# Patient Record
Sex: Male | Born: 1955 | Race: White | Hispanic: No | Marital: Married | Smoking: Never smoker
Health system: Southern US, Community
[De-identification: ages and names within clinical notes are randomized; demographics above are authoritative.]

## PROBLEM LIST (undated history)

## (undated) DIAGNOSIS — R1310 Dysphagia, unspecified: Secondary | ICD-10-CM

## (undated) DIAGNOSIS — K219 Gastro-esophageal reflux disease without esophagitis: Secondary | ICD-10-CM

## (undated) DIAGNOSIS — R21 Rash and other nonspecific skin eruption: Secondary | ICD-10-CM

## (undated) HISTORY — PX: OTHER SURGICAL HISTORY: SHX169

## (undated) HISTORY — PX: HERNIA REPAIR: SHX51

---

## 2014-11-27 ENCOUNTER — Emergency Department (HOSPITAL_COMMUNITY)
Admission: EM | Admit: 2014-11-27 | Discharge: 2014-11-27 | Disposition: A | Discharge status: Home / Self Care | Payer: BLUE CROSS/BLUE SHIELD | Attending: Emergency Medicine | Admitting: Emergency Medicine

## 2014-11-27 ENCOUNTER — Encounter (HOSPITAL_COMMUNITY): Payer: Self-pay | Admitting: Emergency Medicine

## 2014-11-27 ENCOUNTER — Emergency Department (HOSPITAL_COMMUNITY): Payer: BLUE CROSS/BLUE SHIELD

## 2014-11-27 DIAGNOSIS — R1310 Dysphagia, unspecified: Secondary | ICD-10-CM | POA: Diagnosis present

## 2014-11-27 DIAGNOSIS — Z79899 Other long term (current) drug therapy: Secondary | ICD-10-CM | POA: Insufficient documentation

## 2014-11-27 DIAGNOSIS — Z880 Allergy status to penicillin: Secondary | ICD-10-CM | POA: Insufficient documentation

## 2014-11-27 LAB — CBC WITH DIFFERENTIAL/PLATELET
BASOS ABS: 0.1 10*3/uL (ref 0.0–0.1)
Basophils Relative: 1 % (ref 0–1)
Eosinophils Absolute: 0.2 10*3/uL (ref 0.0–0.7)
Eosinophils Relative: 2 % (ref 0–5)
HCT: 46.1 % (ref 39.0–52.0)
Hemoglobin: 14.7 g/dL (ref 13.0–17.0)
LYMPHS ABS: 1.5 10*3/uL (ref 0.7–4.0)
LYMPHS PCT: 16 % (ref 12–46)
MCH: 27.5 pg (ref 26.0–34.0)
MCHC: 31.9 g/dL (ref 30.0–36.0)
MCV: 86.2 fL (ref 78.0–100.0)
Monocytes Absolute: 0.6 10*3/uL (ref 0.1–1.0)
Monocytes Relative: 7 % (ref 3–12)
Neutro Abs: 6.7 10*3/uL (ref 1.7–7.7)
Neutrophils Relative %: 74 % (ref 43–77)
Platelets: 258 10*3/uL (ref 150–400)
RBC: 5.35 MIL/uL (ref 4.22–5.81)
RDW: 14.3 % (ref 11.5–15.5)
WBC: 9.1 10*3/uL (ref 4.0–10.5)

## 2014-11-27 LAB — COMPREHENSIVE METABOLIC PANEL
ALBUMIN: 4.4 g/dL (ref 3.5–5.2)
ALK PHOS: 83 U/L (ref 39–117)
ALT: 22 U/L (ref 0–53)
AST: 22 U/L (ref 0–37)
Anion gap: 9 (ref 5–15)
BILIRUBIN TOTAL: 0.8 mg/dL (ref 0.3–1.2)
BUN: 19 mg/dL (ref 6–23)
CHLORIDE: 106 mmol/L (ref 96–112)
CO2: 25 mmol/L (ref 19–32)
CREATININE: 0.95 mg/dL (ref 0.50–1.35)
Calcium: 9.2 mg/dL (ref 8.4–10.5)
GFR calc Af Amer: >90 mL/min (ref >90)
GFR calc non Af Amer: 89 mL/min — ABNORMAL LOW (ref >90)
Glucose, Bld: 100 mg/dL — ABNORMAL HIGH (ref 70–99)
Potassium: 3.8 mmol/L (ref 3.5–5.1)
SODIUM: 140 mmol/L (ref 135–145)
TOTAL PROTEIN: 7.4 g/dL (ref 6.0–8.3)

## 2014-11-27 MED ORDER — SODIUM CHLORIDE 0.9 % IV BOLUS (SEPSIS)
1000.0000 mL | Freq: Once | INTRAVENOUS | Status: AC
  Administered 2014-11-27: 1000 mL via INTRAVENOUS

## 2014-11-27 MED ORDER — GLUCAGON HCL RDNA (DIAGNOSTIC) 1 MG IJ SOLR
1.0000 mg | Freq: Once | INTRAMUSCULAR | Status: AC
  Administered 2014-11-27: 1 mg via INTRAVENOUS
  Filled 2014-11-27: qty 1

## 2014-11-27 MED ORDER — METOCLOPRAMIDE HCL 5 MG/ML IJ SOLN
10.0000 mg | Freq: Once | INTRAMUSCULAR | Status: AC
  Administered 2014-11-27: 10 mg via INTRAVENOUS
  Filled 2014-11-27: qty 2

## 2014-11-27 MED ORDER — DIPHENHYDRAMINE HCL 50 MG/ML IJ SOLN
25.0000 mg | Freq: Once | INTRAMUSCULAR | Status: AC
  Administered 2014-11-27: 25 mg via INTRAVENOUS
  Filled 2014-11-27: qty 1

## 2014-11-27 MED ORDER — METOCLOPRAMIDE HCL 10 MG PO TABS: 10.0000 mg | ORAL_TABLET | Freq: Four times a day (QID) | ORAL | Status: DC | PRN

## 2014-11-27 MED ORDER — GI COCKTAIL ~~LOC~~
30.0000 mL | Freq: Once | ORAL | Status: AC
  Administered 2014-11-27: 30 mL via ORAL
  Filled 2014-11-27: qty 30

## 2014-11-27 NOTE — ED Notes (Signed)
Pt reports that he had "pressure" to esophagus prior to meds, but now has been relieved

## 2014-11-27 NOTE — ED Notes (Signed)
Pt from home reports that he has not swallowed fluids and kept them down since approx 11pm last night. Pt reports that woke up and felt his esophagus was inflamed. Pt sts that this has happened before we he was under stress. Pt denies N/V/D, fever, CP, SOB, visual changes, dizziness and reports he had normal BM yesterday. Pt neuro exam normal. Pt is A&O x4 and in NAD. Dr Silverio LayYao at bedside.

## 2014-11-27 NOTE — Discharge Instructions (Signed)
Stay hydrated.   Soft diet for a week.   Take reglan as needed for nausea or vomiting.   Follow up with GI. You will need to get endoscopy.   Return to ER if you have trouble swallowing, unable to keep fluids down, food stuck in throat.

## 2014-11-27 NOTE — ED Notes (Addendum)
Pt reports inability to swallow starting last night. Denies drooling, no stridor noted. Says this has been a chronic problem especially when he becomes stressed. Has seen NP and she told him "to relax and chew my food." Denies SOB, chest pain, but says, "now I feel like I need to spit my saliva out." RR even/unlabored. Reports feeling, "bloated." No other issues/concerns. Denies hx strokes, cardiac problems.

## 2014-11-27 NOTE — ED Notes (Signed)
Pt stood and reports that he "felt it go down." Pt sts that he feels much better and is less anxious than before. Pt is sipping water to make sure that it stays down. Pt is A&O and in NAD

## 2014-11-27 NOTE — ED Provider Notes (Signed)
CSN: [id number]     Arrival date & time 11/27/14  1235 History   First MD Initiated Contact with Patient 11/27/14 1500     Chief Complaint  Patient presents with  . Trouble swallowing      (Consider location/radiation/quality/duration/timing/severity/associated sxs/prior Treatment) The history is provided by the patient.  Mitchell Montoya is a 59 y.o. male history hernia repair here with trouble swallowing. He states that he has intermittent trouble swallowing for years. He was told that it's because he has anxiety but never had endoscopy. He ate some chicken and drink some milk last night around 11 PM. He woke up this morning and was unable to keep water down and needed to spit up his saliva. He also vomited up some pieces of chicken. Never had food impaction before. Never saw a GI doctor previously.    History reviewed. No pertinent past medical history. Past Surgical History  Procedure Laterality Date  . Hernia repair     History reviewed. No pertinent family history. History  Substance Use Topics  . Smoking status: Never Smoker   . Smokeless tobacco: Not on file  . Alcohol Use: No    Review of Systems  Gastrointestinal:       Foreign body sensation in chest   All other systems reviewed and are negative.     Allergies  Penicillins  Home Medications   Prior to Admission medications   Medication Sig Start Date End Date Taking? Authorizing Provider  cholecalciferol (VITAMIN D) 1000 UNITS tablet Take 1,000 Units by mouth daily.   Yes Historical Provider, MD  ibuprofen (ADVIL,MOTRIN) 200 MG tablet Take 400 mg by mouth every 6 (six) hours as needed for moderate pain.   Yes Historical Provider, MD  Magnesium 200 MG TABS Take 200 mg by mouth daily as needed (patient preference).   Yes Historical Provider, MD  vitamin C (ASCORBIC ACID) 500 MG tablet Take 500 mg by mouth daily.   Yes Historical Provider, MD   BP 132/89 mmHg  Pulse 57  Temp(Src) 98.3 F (36.8 C) (Oral)  Resp  18  SpO2 97% Physical Exam  Constitutional: He is oriented to person, place, and time. He appears well-developed and well-nourished.  HENT:  Head: Normocephalic.  MM slightly dry. No obvious foreign body in OP   Eyes: Conjunctivae are normal. Pupils are equal, round, and reactive to light.  Neck: Normal range of motion. Neck supple.  Cardiovascular: Normal rate, regular rhythm and normal heart sounds.   Pulmonary/Chest: Effort normal and breath sounds normal. No respiratory distress. He has no wheezes. He has no rales.  Abdominal: Soft. Bowel sounds are normal. He exhibits no distension. There is no tenderness. There is no rebound and no guarding.  Musculoskeletal: Normal range of motion. He exhibits no edema or tenderness.  Neurological: He is alert and oriented to person, place, and time.  Skin: Skin is warm and dry.  Psychiatric: He has a normal mood and affect. His behavior is normal. Judgment and thought content normal.  Nursing note and vitals reviewed.   ED Course  Procedures (including critical care time) Labs Review Labs Reviewed  COMPREHENSIVE METABOLIC PANEL - Abnormal; Notable for the following:    Glucose, Bld 100 (*)    GFR calc non Af Amer 89 (*)    All other components within normal limits  CBC WITH DIFFERENTIAL/PLATELET    Imaging Review Dg Chest 2 View  11/27/2014   CLINICAL DATA:  Pt c/o "choking" sensation since last night, unable  to swallow anything, including liquids, saliva. Was onset when going to bed, not at meal time. Pt reports prior episodes occasionally of this over the past 30 years. Denies chest complaints, denies fever, denies gerd, nonsmoker  EXAM: CHEST  2 VIEW  COMPARISON:  None.  FINDINGS: Cardiac silhouette normal in size and configuration. No mediastinal or hilar masses or evidence of adenopathy.  Lungs are clear.  No pleural effusion or pneumothorax.  Bony thorax is intact.  IMPRESSION: No active cardiopulmonary disease.   Electronically Signed    By: Amie Portland M.D.   On: 11/27/2014 16:52     EKG Interpretation None      MDM   Final diagnoses:  None   Mitchell Montoya is a 58 y.o. male here with possible food impaction. Will get cxr and try glucagon and reglan. If he still can't tolerate PO, will need endoscopy.   6:20 PM CXR showed no obvious foreign body. Able to swallow after glucagon and reglan. Tolerated fluids and GI cocktail. Will dc home with GI f/u.    Richardean Canal, MD 11/27/14 Zollie Pee

## 2014-12-16 ENCOUNTER — Other Ambulatory Visit: Payer: Self-pay | Admitting: Gastroenterology

## 2014-12-16 DIAGNOSIS — R1310 Dysphagia, unspecified: Secondary | ICD-10-CM

## 2014-12-20 ENCOUNTER — Ambulatory Visit
Admission: RE | Admit: 2014-12-20 | Discharge: 2014-12-20 | Disposition: A | Discharge status: Home / Self Care | Payer: BLUE CROSS/BLUE SHIELD | Source: Ambulatory Visit | Attending: Gastroenterology | Admitting: Gastroenterology

## 2014-12-20 DIAGNOSIS — R1310 Dysphagia, unspecified: Secondary | ICD-10-CM

## 2014-12-29 ENCOUNTER — Encounter (HOSPITAL_COMMUNITY): Payer: Self-pay | Admitting: *Deleted

## 2015-01-09 ENCOUNTER — Encounter (HOSPITAL_COMMUNITY): Payer: Self-pay | Admitting: Anesthesiology

## 2015-01-09 NOTE — Anesthesia Preprocedure Evaluation (Signed)
Anesthesia Evaluation  Patient identified by MRN, date of birth, ID band Patient awake    Reviewed: Allergy & Precautions, NPO status , Patient's Chart, lab work & pertinent test results  Airway Mallampati: II  TM Distance: >3 FB Neck ROM: Full    Dental no notable dental hx.    Pulmonary neg pulmonary ROS,  breath sounds clear to auscultation  Pulmonary exam normal       Cardiovascular negative cardio ROS  Rhythm:Regular Rate:Normal     Neuro/Psych negative neurological ROS  negative psych ROS   GI/Hepatic Neg liver ROS, GERD-  ,  Endo/Other  negative endocrine ROS  Renal/GU negative Renal ROS  negative genitourinary   Musculoskeletal negative musculoskeletal ROS (+)   Abdominal   Peds negative pediatric ROS (+)  Hematology negative hematology ROS (+)   Anesthesia Other Findings   Reproductive/Obstetrics negative OB ROS                             Anesthesia Physical Anesthesia Plan  ASA: II  Anesthesia Plan: MAC   Post-op Pain Management:    Induction: Intravenous  Airway Management Planned:   Additional Equipment:   Intra-op Plan:   Post-operative Plan:   Informed Consent: I have reviewed the patients History and Physical, chart, labs and discussed the procedure including the risks, benefits and alternatives for the proposed anesthesia with the patient or authorized representative who has indicated his/her understanding and acceptance.   Dental advisory given  Plan Discussed with: CRNA  Anesthesia Plan Comments:         Anesthesia Quick Evaluation

## 2015-01-10 ENCOUNTER — Ambulatory Visit (HOSPITAL_COMMUNITY): Payer: BLUE CROSS/BLUE SHIELD | Admitting: Anesthesiology

## 2015-01-10 ENCOUNTER — Ambulatory Visit (HOSPITAL_COMMUNITY)
Admission: RE | Admit: 2015-01-10 | Discharge: 2015-01-10 | Disposition: A | Discharge status: Home / Self Care | Payer: BLUE CROSS/BLUE SHIELD | Source: Ambulatory Visit | Attending: Gastroenterology | Admitting: Gastroenterology

## 2015-01-10 ENCOUNTER — Encounter (HOSPITAL_COMMUNITY)
Admission: RE | Disposition: A | Discharge status: Home / Self Care | Payer: Self-pay | Source: Ambulatory Visit | Attending: Gastroenterology

## 2015-01-10 ENCOUNTER — Ambulatory Visit (HOSPITAL_COMMUNITY): Payer: BLUE CROSS/BLUE SHIELD

## 2015-01-10 ENCOUNTER — Other Ambulatory Visit: Payer: Self-pay | Admitting: Gastroenterology

## 2015-01-10 ENCOUNTER — Encounter (HOSPITAL_COMMUNITY): Payer: Self-pay

## 2015-01-10 DIAGNOSIS — K219 Gastro-esophageal reflux disease without esophagitis: Secondary | ICD-10-CM | POA: Diagnosis not present

## 2015-01-10 DIAGNOSIS — K5710 Diverticulosis of small intestine without perforation or abscess without bleeding: Secondary | ICD-10-CM | POA: Insufficient documentation

## 2015-01-10 DIAGNOSIS — Z79899 Other long term (current) drug therapy: Secondary | ICD-10-CM | POA: Insufficient documentation

## 2015-01-10 DIAGNOSIS — R1310 Dysphagia, unspecified: Secondary | ICD-10-CM

## 2015-01-10 DIAGNOSIS — Z791 Long term (current) use of non-steroidal anti-inflammatories (NSAID): Secondary | ICD-10-CM | POA: Insufficient documentation

## 2015-01-10 DIAGNOSIS — K222 Esophageal obstruction: Secondary | ICD-10-CM | POA: Diagnosis not present

## 2015-01-10 DIAGNOSIS — Z7951 Long term (current) use of inhaled steroids: Secondary | ICD-10-CM | POA: Diagnosis not present

## 2015-01-10 HISTORY — PX: ESOPHAGOGASTRODUODENOSCOPY (EGD) WITH PROPOFOL: SHX5813

## 2015-01-10 HISTORY — DX: Gastro-esophageal reflux disease without esophagitis: K21.9

## 2015-01-10 HISTORY — DX: Dysphagia, unspecified: R13.10

## 2015-01-10 HISTORY — DX: Rash and other nonspecific skin eruption: R21

## 2015-01-10 HISTORY — PX: SAVORY DILATION: SHX5439

## 2015-01-10 SURGERY — ESOPHAGOGASTRODUODENOSCOPY (EGD) WITH PROPOFOL
Anesthesia: Monitor Anesthesia Care

## 2015-01-10 MED ORDER — ONDANSETRON HCL 4 MG/2ML IJ SOLN
INTRAMUSCULAR | Status: DC | PRN
  Administered 2015-01-10: 4 mg via INTRAVENOUS

## 2015-01-10 MED ORDER — LACTATED RINGERS IV SOLN
INTRAVENOUS | Status: DC | PRN
  Administered 2015-01-10: 11:00:00 via INTRAVENOUS

## 2015-01-10 MED ORDER — LACTATED RINGERS IV SOLN: INTRAVENOUS | Status: DC

## 2015-01-10 MED ORDER — SODIUM CHLORIDE 0.9 % IV SOLN: INTRAVENOUS | Status: DC

## 2015-01-10 MED ORDER — PROPOFOL 10 MG/ML IV BOLUS
INTRAVENOUS | Status: AC
  Filled 2015-01-10: qty 20

## 2015-01-10 MED ORDER — PROPOFOL INFUSION 10 MG/ML OPTIME
INTRAVENOUS | Status: DC | PRN
  Administered 2015-01-10: 200 ug/kg/min via INTRAVENOUS

## 2015-01-10 MED ORDER — KETAMINE HCL 10 MG/ML IJ SOLN
INTRAMUSCULAR | Status: DC | PRN
  Administered 2015-01-10: 20 mg via INTRAVENOUS

## 2015-01-10 SURGICAL SUPPLY — 14 items

## 2015-01-10 NOTE — Anesthesia Postprocedure Evaluation (Signed)
  Anesthesia Post-op Note  Patient: [name]  Procedure(s) Performed: Procedure(s) (LRB): ESOPHAGOGASTRODUODENOSCOPY (EGD) WITH PROPOFOL (N/A) SAVORY DILATION (N/A)  Patient Location: PACU  Anesthesia Type: MAC  Level of Consciousness: awake and alert   Airway and Oxygen Therapy: Patient Spontanous Breathing  Post-op Pain: mild  Post-op Assessment: Post-op Vital signs reviewed, Patient's Cardiovascular Status Stable, Respiratory Function Stable, Patent Airway and No signs of Nausea or vomiting  Last Vitals:  Filed Vitals:   01/10/15 1137  BP: 123/80  Pulse: 59  Temp: 36.5 C  Resp: 12    Post-op Vital Signs: stable   Complications: No apparent anesthesia complications

## 2015-01-10 NOTE — H&P (Signed)
  Subjective:   Patient is a 59 y.o. male presents with dysphagia for solids. He had occasional heartburn. He went [place] the [place] in another city with what was felt to be obstruction include impaction and this did pass. He has been on omeprazole since I saw him in the office. We obtained a variable swallow which showed normal esophageal motility with a smooth stricture at the GE junction with a small diverticula with hangup over 13 mm barium tablet did eventually pass. There was also some reflux seen. The procedures been discussed extensively with him here and in the office.. Procedure including risks and benefits discussed in office.  There are no active problems to display for this patient.  Past Medical History  Diagnosis Date  . GERD (gastroesophageal reflux disease)     silent gerd  . Trouble swallowing   . Rash     occasional groin rash, has [facility] and peggy brewer np for uses athletes creme for if needed    Past Surgical History  Procedure Laterality Date  . Hernia repair  yrs ago  . Testicular tortion  yrs ago    Prescriptions prior to admission  Medication Sig Dispense Refill Last Dose  . cholecalciferol (VITAMIN D) 1000 UNITS tablet Take 1,000 Units by mouth daily.   01/09/2015 at 1200  . fluticasone (FLONASE) 50 MCG/ACT nasal spray Place 1 spray into both nostrils daily.   Past Week at Unknown time  . ibuprofen (ADVIL,MOTRIN) 200 MG tablet Take 400 mg by mouth every 6 (six) hours as needed for moderate pain.   01/09/2015 at 1600  . Magnesium 200 MG TABS Take 200 mg by mouth daily as needed (patient preference).   Past Month at Unknown time  . montelukast (SINGULAIR) 10 MG tablet Take 10 mg by mouth at bedtime.   Past Week at Unknown time  . omeprazole (PRILOSEC OTC) 20 MG tablet Take 20 mg by mouth daily.   01/10/2015 at 0730  . vitamin C (ASCORBIC ACID) 500 MG tablet Take 500 mg by mouth daily.   Past Month at Unknown time  . metoCLOPramide (REGLAN) 10 MG tablet  Take 1 tablet (10 mg total) by mouth every 6 (six) hours as needed for nausea. (Patient not taking: Reported on 12/22/2014) 10 tablet 0    Allergies  Allergen Reactions  . Penicillins Hives    History  Substance Use Topics  . Smoking status: Never Smoker   . Smokeless tobacco: Never Used  . Alcohol Use: No    History reviewed. No pertinent family history.   Objective:   Patient Vitals for the past 8 hrs:  BP Temp Temp src Pulse Resp SpO2 Height Weight  01/10/15 1017 (!) 153/85 mmHg 97.9 F (36.6 C) Oral 63 10 100 % 6\' 3"  (1.905 m) 117.935 kg (260 lb)         See MD Preop evaluation      Assessment:   1. Reflux induced esophageal stricture  Plan:   Will proceed at this time with EGD and Savary dilatation using propofol sedation and fluoroscopy.

## 2015-01-10 NOTE — Addendum Note (Signed)
Addended by: Kinisha Soper JR. on: 01/10/2015 09:31 AM   Modules accepted: Orders  

## 2015-01-10 NOTE — Op Note (Signed)
[facility] [address] [place], [id number]   ENDOSCOPY PROCEDURE REPORT  PATIENT: Mitchell Montoya, Mitchell Montoya  MR#: [id number] BIRTHDATE: 10-26-1955 , 58  yrs. old GENDER: male ENDOSCOPIST:Alyissa Whidbee Randa EvensEdwards, MD REFERRED BY: Corrinne EaglePeggy Brerwer, M.D. PROCEDURE DATE:  01/10/2015 PROCEDURE:   EGD w/ wire guided (savary) dilation ASA CLASS:    Class II INDICATIONS: dysphagia with slight stricture seen on barium swallow with esophageal reflux. MEDICATION: Monitored anesthesia care TOPICAL ANESTHETIC:   Cetacaine Spray  DESCRIPTION OF PROCEDURE:   After the risks and benefits of the procedure were explained, informed consent was obtained.  The endoscope was introduced through the mouth  and advanced to the second portion of the duodenum .  The instrument was slowly withdrawn as the mucosa was fully examined. Estimated blood loss is zero unless otherwise noted in this procedure report.      ESOPHAGUS: Slight stricture at GE junction.  Using fluoroscopic guidance was dilated with 14, 15, and 16 mm Savary dilator with small amount heme.   No Barrett's esophagus or active esophagitis.   STOMACH: The mucosa of the stomach appeared normal.  DUODENUM: The duodenal mucosa showed no abnormalities. Retroflexed views revealed no abnormalities.    The scope was then withdrawn from the patient and the procedure completed.  COMPLICATIONS: There were no immediate complications.  ENDOSCOPIC IMPRESSION: 1.   Slight stricture at GE junction.  Using fluoroscopic guidance was dilated with 14, 15, and 16 mm Savary dilator with small amount heme 2.   No Barrett's esophagus or active esophagitis 3.   The mucosa of the stomach appeared normal 4.   The duodenal mucosa showed no abnormalities RECOMMENDATIONS: Continue current medications routine post dilatation orders   _______________________________ eSignedCarman Ching:  Kieana Livesay, MD 01/10/2015 11:37 AM   standard discharge  cc: Dr. Tommas OlpPeggy  Brewer  CPT CODES: ICD CODES:  The ICD and CPT codes recommended by this software are interpretations from the data that the clinical staff has captured with the software.  The verification of the translation of this report to the ICD and CPT codes and modifiers is the sole responsibility of the health care institution and practicing physician where this report was generated.  [employer] will not be held responsible for the validity of the ICD and CPT codes included on this report.  AMA assumes no liability for data contained or not contained herein. CPT is a Publishing rights managerregistered trademark of the [employer].  PATIENT NAME:  Mitchell Montoya, Mitchell Montoya MR#: [id number]

## 2015-01-10 NOTE — Discharge Instructions (Signed)
Continue current meds. Clear liquids for 4-6 hours, if no chest pain or trouble breathing, soft foods tonight.  Regular diet tomorrow. Call for problems. °

## 2015-01-10 NOTE — Transfer of Care (Signed)
Immediate Anesthesia Transfer of Care Note  Patient: Mitchell Montoya  Procedure(s) Performed: Procedure(s): ESOPHAGOGASTRODUODENOSCOPY (EGD) WITH PROPOFOL (N/A) SAVORY DILATION (N/A)  Patient Location: PACU  Anesthesia Type:MAC  Level of Consciousness:  sedated, patient cooperative and responds to stimulation  Airway & Oxygen Therapy:Patient Spontanous Breathing and Patient connected to face mask oxgen  Post-op Assessment:  Report given to PACU RN and Post -op Vital signs reviewed and stable  Post vital signs:  Reviewed and stable  Last Vitals:  Filed Vitals:   01/10/15 1017  BP: 153/85  Pulse: 63  Temp: 36.6 C  Resp: 10    Complications: No apparent anesthesia complications

## 2015-01-11 ENCOUNTER — Encounter (HOSPITAL_COMMUNITY): Payer: Self-pay | Admitting: Gastroenterology

## 2016-01-28 IMAGING — RF DG ESOPHAGUS
14 of 16 series · 19 of 24 positions shown · non-contrast
Comparison: None.

CLINICAL DATA: Dysphagia.

EXAM:
ESOPHOGRAM / BARIUM SWALLOW / BARIUM TABLET STUDY
TECHNIQUE: Combined double contrast and single contrast examination performed
using effervescent crystals, thick barium liquid, and thin barium
liquid. The patient was observed with fluoroscopy swallowing a 13mm
barium sulphate tablet.
FLUOROSCOPY TIME:  Radiation Exposure Index (as provided by the
fluoroscopic device): 59 dGy cubic cm
If the device does not provide the exposure index:
Fluoroscopy Time:  2 minutes and 0 seconds
Number of Acquired Images:

[Series 1: run · 2 of 8 slices shown (1 of 14)]
[im 1/8]
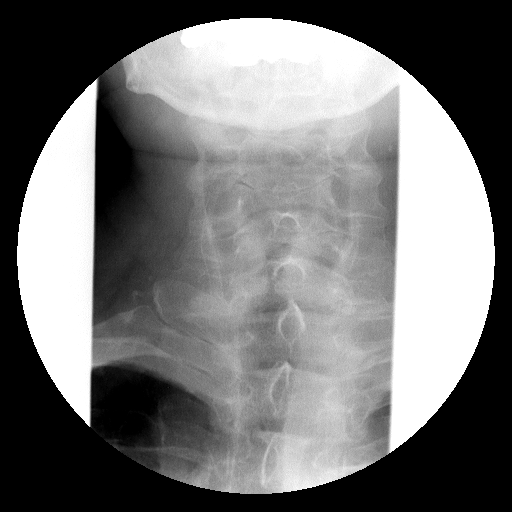
[im 4/8]
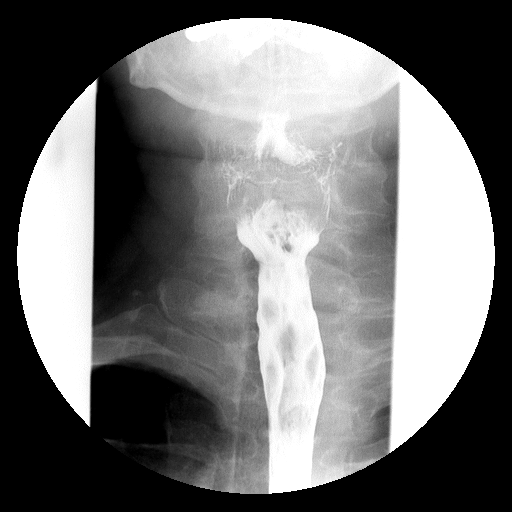

[Series 2: run · 3 of 8 slices shown (2 of 14)]
[im 1/8]
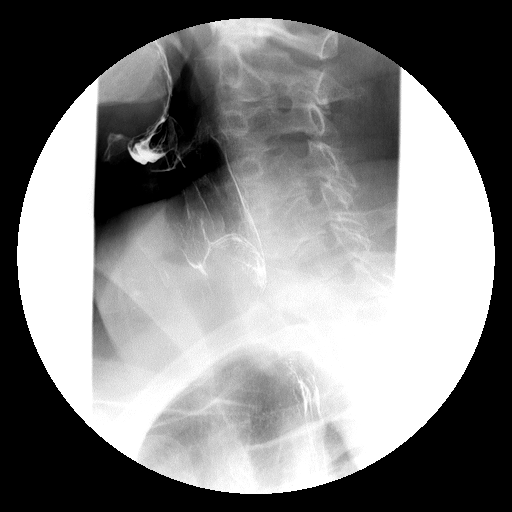
[im 4/8]
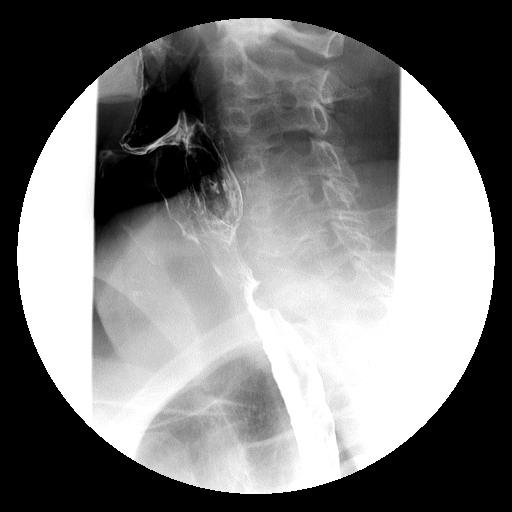
[im 8/8]
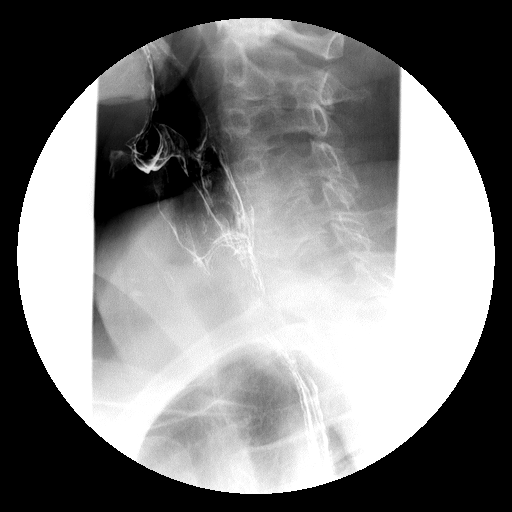

[Series 3: run · 2 of 6 slices shown (3 of 14)]
[im 1/6]
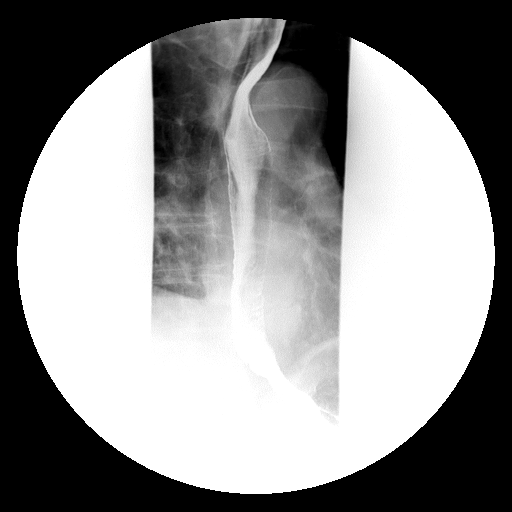
[im 6/6]
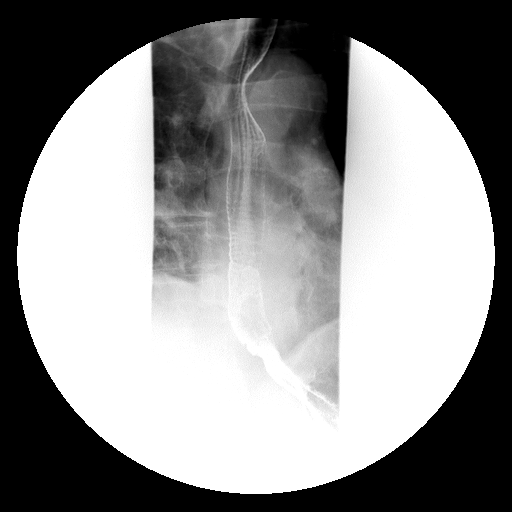

[Series 4: run · 2 of 7 slices shown (4 of 14)]
[im 1/7]
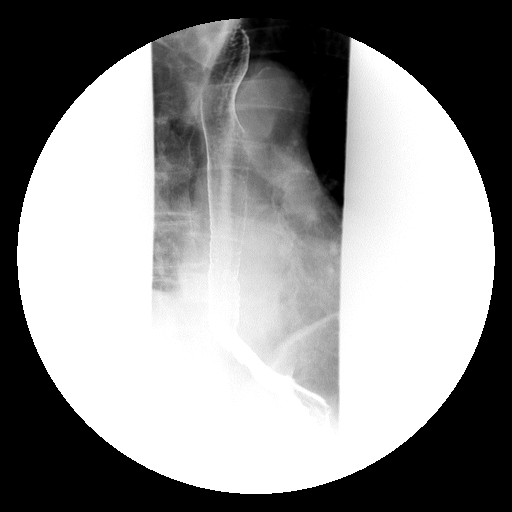
[im 4/7]
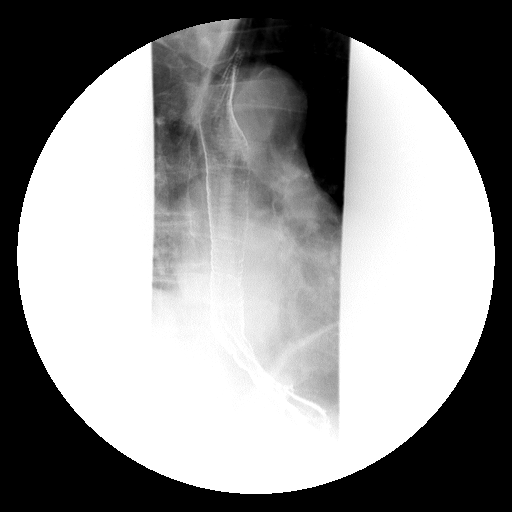

[Series 5: run · 1 of 1 slices shown (5 of 14)]
[im 1/1]
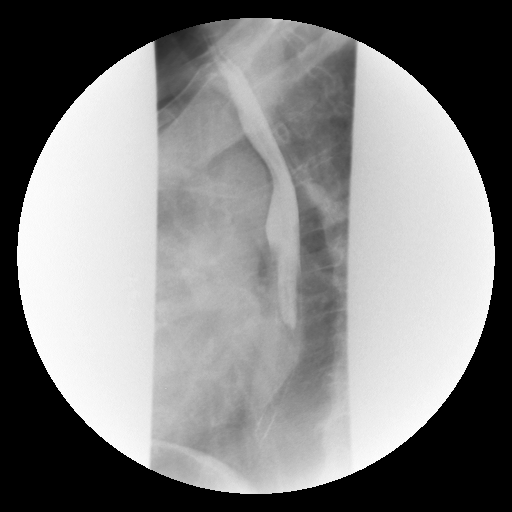

[Series 6: run · 1 of 1 slices shown (6 of 14)]
[im 1/1]
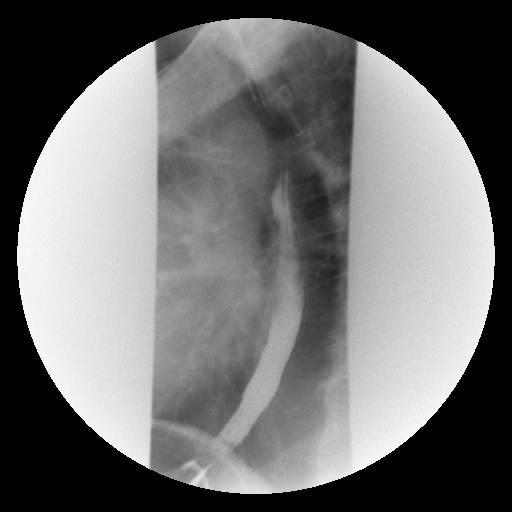

[Series 7: run · 1 of 1 slices shown (7 of 14)]
[im 1/1]
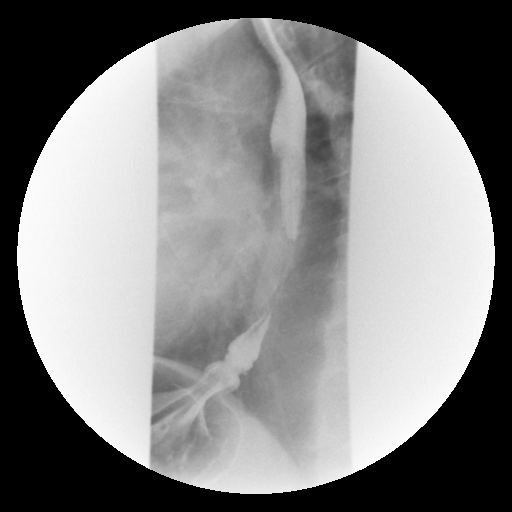

[Series 8: run · 1 of 1 slices shown (8 of 14)]
[im 1/1]
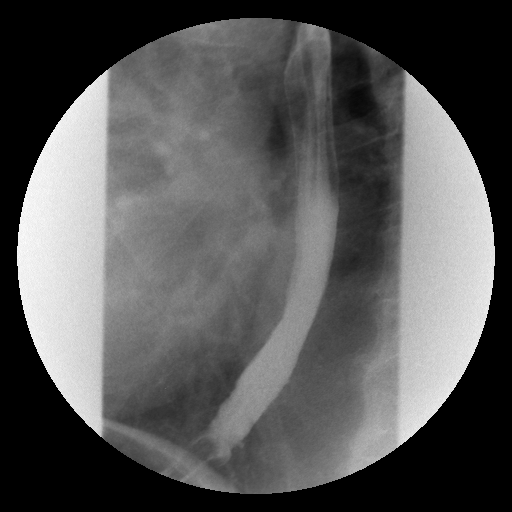

[Series 10: run · 1 of 1 slices shown (9 of 14)]
[im 1/1]
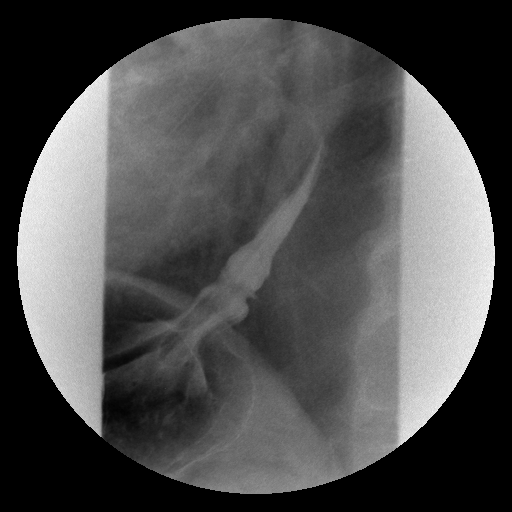

[Series 11: run · 1 of 1 slices shown (10 of 14)]
[im 1/1]
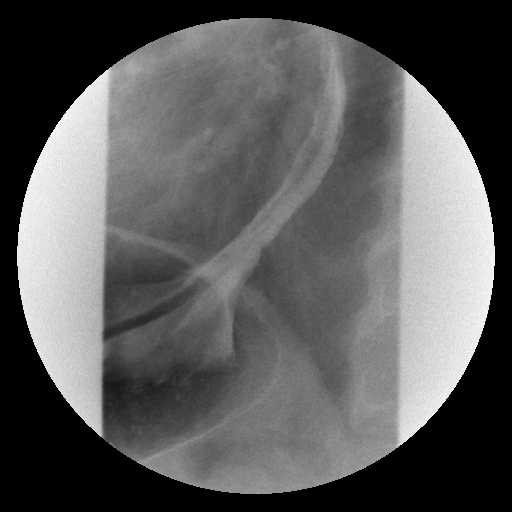

[Series 12: run · 1 of 1 slices shown (11 of 14)]
[im 1/1]
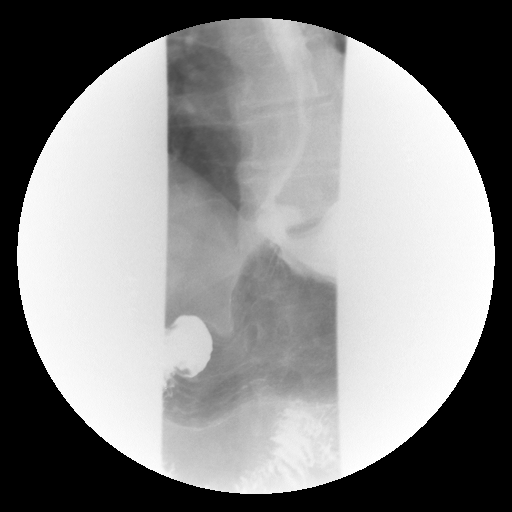

[Series 13: run · 1 of 1 slices shown (12 of 14)]
[im 1/1]
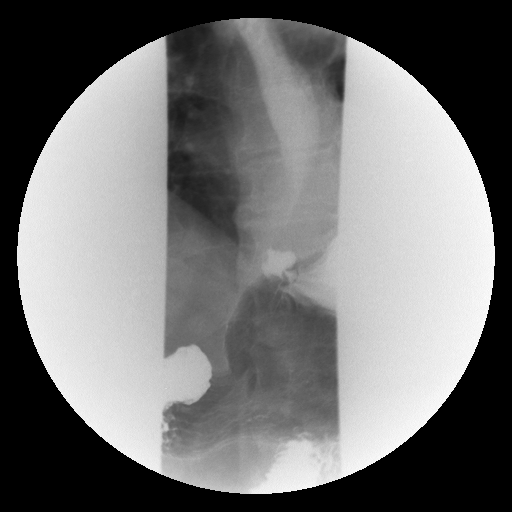

[Series 15: run · 1 of 1 slices shown (13 of 14)]
[im 1/1]
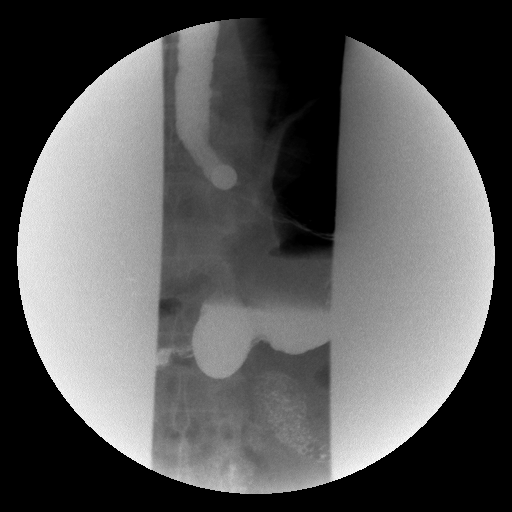

[Series 16: run · 1 of 1 slices shown (14 of 14)]
[im 1/1]
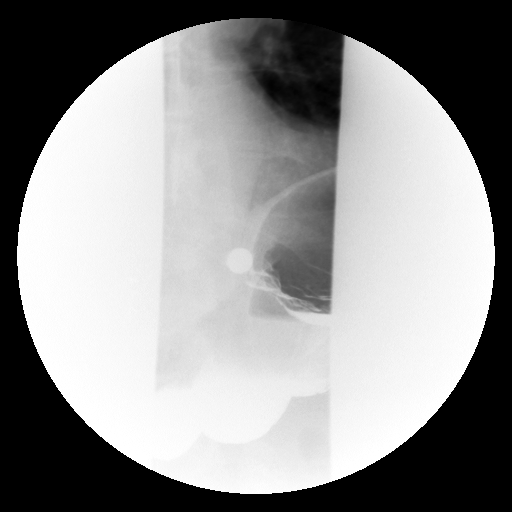

[19 of 24 positions shown; findings below may reference images not displayed]

FINDINGS: Initial barium swallows demonstrate normal pharyngeal motion with
swallowing. No laryngeal penetration or aspiration. No upper
esophageal webs, strictures or diverticuli.

Normal esophageal motility. No intrinsic or extrinsic lesions are
identified. There is a small sliding-type hiatal hernia with smooth
narrowing at the GE junction and a small diverticuli. Possible
combination of the lower esophageal mucosal ring and reflux
stricture. The 13 mm barium pill eventually was able to pass through
this area. GE reflux was demonstrated with water swallowing.
IMPRESSION: Small hiatal hernia with inducible GE reflux.

Probable combination of a lower esophageal mucosal ring and reflux
stricture.

## 2019-11-19 ENCOUNTER — Ambulatory Visit: Payer: BLUE CROSS/BLUE SHIELD

## 2019-11-19 ENCOUNTER — Ambulatory Visit: Payer: BLUE CROSS/BLUE SHIELD | Attending: Internal Medicine

## 2019-11-19 DIAGNOSIS — Z23 Encounter for immunization: Secondary | ICD-10-CM

## 2019-11-19 NOTE — Progress Notes (Signed)
   Covid-19 Vaccination Clinic  Name:  Mitchell Montoya    MRN: [id number] DOB: 09-04-1956  11/19/2019  [name] was observed post Covid-19 immunization for 15 minutes without incident. He was provided with Vaccine Information Sheet and instruction to access the V-Safe system.   [name] was instructed to call 911 with any severe reactions post vaccine: Marland Kitchen Difficulty breathing  . Swelling of face and throat  . A fast heartbeat  . A bad rash all over body  . Dizziness and weakness   Immunizations Administered    Name Date Dose VIS Date Route   Pfizer COVID-19 Vaccine 11/19/2019 12:56 PM 0.3 mL 08/21/2019 Intramuscular   Manufacturer: [employer], [employer]   Lot: [id number]   NDC: [address]-0

## 2019-12-14 ENCOUNTER — Ambulatory Visit: Payer: BLUE CROSS/BLUE SHIELD | Attending: Internal Medicine

## 2019-12-14 DIAGNOSIS — Z23 Encounter for immunization: Secondary | ICD-10-CM

## 2019-12-14 NOTE — Progress Notes (Signed)
   Covid-19 Vaccination Clinic  Name:  Mitchell Montoya    MRN: [id number] DOB: June 22, 1956  12/14/2019  Mitchell Montoya was observed post Covid-19 immunization for 15 minutes without incident. He was provided with Vaccine Information Sheet and instruction to access the V-Safe system.   Mitchell Montoya was instructed to call 911 with any severe reactions post vaccine: Marland Kitchen Difficulty breathing  . Swelling of face and throat  . A fast heartbeat  . A bad rash all over body  . Dizziness and weakness   Immunizations Administered    Name Date Dose VIS Date Route   Pfizer COVID-19 Vaccine 12/14/2019 12:36 PM 0.3 mL 08/21/2019 Intramuscular   Manufacturer: [employer], [employer]   Lot: [id number]   NDC: [address]-2
